# Patient Record
Sex: Female | Born: 1975 | Race: White | Hispanic: No | Marital: Married | Smoking: Current every day smoker
Health system: Southern US, Community
[De-identification: ages and names within clinical notes are randomized; demographics above are authoritative.]

## PROBLEM LIST (undated history)

## (undated) DIAGNOSIS — IMO0002 Reserved for concepts with insufficient information to code with codable children: Secondary | ICD-10-CM

## (undated) DIAGNOSIS — M069 Rheumatoid arthritis, unspecified: Secondary | ICD-10-CM

## (undated) DIAGNOSIS — J449 Chronic obstructive pulmonary disease, unspecified: Secondary | ICD-10-CM

## (undated) DIAGNOSIS — M329 Systemic lupus erythematosus, unspecified: Secondary | ICD-10-CM

## (undated) DIAGNOSIS — F419 Anxiety disorder, unspecified: Secondary | ICD-10-CM

## (undated) DIAGNOSIS — F32A Depression, unspecified: Secondary | ICD-10-CM

## (undated) DIAGNOSIS — M797 Fibromyalgia: Secondary | ICD-10-CM

## (undated) DIAGNOSIS — F329 Major depressive disorder, single episode, unspecified: Secondary | ICD-10-CM

## (undated) DIAGNOSIS — F319 Bipolar disorder, unspecified: Secondary | ICD-10-CM

## (undated) HISTORY — PX: CHOLECYSTECTOMY: SHX55

## (undated) HISTORY — PX: TONSILLECTOMY AND ADENOIDECTOMY: SUR1326

## (undated) HISTORY — PX: GASTRIC BYPASS: SHX52

---

## 2017-07-30 ENCOUNTER — Emergency Department (HOSPITAL_BASED_OUTPATIENT_CLINIC_OR_DEPARTMENT_OTHER)
Admission: EM | Admit: 2017-07-30 | Discharge: 2017-07-30 | Disposition: A | Discharge status: Home / Self Care | Payer: Medicare Other | Attending: Emergency Medicine | Admitting: Emergency Medicine

## 2017-07-30 ENCOUNTER — Emergency Department (HOSPITAL_BASED_OUTPATIENT_CLINIC_OR_DEPARTMENT_OTHER): Payer: Medicare Other

## 2017-07-30 ENCOUNTER — Other Ambulatory Visit: Payer: Self-pay

## 2017-07-30 ENCOUNTER — Encounter (HOSPITAL_BASED_OUTPATIENT_CLINIC_OR_DEPARTMENT_OTHER): Payer: Self-pay | Admitting: Emergency Medicine

## 2017-07-30 DIAGNOSIS — J449 Chronic obstructive pulmonary disease, unspecified: Secondary | ICD-10-CM | POA: Diagnosis not present

## 2017-07-30 DIAGNOSIS — F17210 Nicotine dependence, cigarettes, uncomplicated: Secondary | ICD-10-CM | POA: Diagnosis not present

## 2017-07-30 DIAGNOSIS — M79671 Pain in right foot: Secondary | ICD-10-CM

## 2017-07-30 HISTORY — DX: Chronic obstructive pulmonary disease, unspecified: J44.9

## 2017-07-30 HISTORY — DX: Anxiety disorder, unspecified: F41.9

## 2017-07-30 HISTORY — DX: Systemic lupus erythematosus, unspecified: M32.9

## 2017-07-30 HISTORY — DX: Bipolar disorder, unspecified: F31.9

## 2017-07-30 HISTORY — DX: Fibromyalgia: M79.7

## 2017-07-30 HISTORY — DX: Major depressive disorder, single episode, unspecified: F32.9

## 2017-07-30 HISTORY — DX: Reserved for concepts with insufficient information to code with codable children: IMO0002

## 2017-07-30 HISTORY — DX: Rheumatoid arthritis, unspecified: M06.9

## 2017-07-30 HISTORY — DX: Depression, unspecified: F32.A

## 2017-07-30 MED ORDER — TRAMADOL HCL 50 MG PO TABS
50.0000 mg | ORAL_TABLET | Freq: Once | ORAL | Status: AC
  Administered 2017-07-30: 50 mg via ORAL
  Filled 2017-07-30: qty 1

## 2017-07-30 MED ORDER — IBUPROFEN 800 MG PO TABS: 800.0000 mg | ORAL_TABLET | Freq: Three times a day (TID) | ORAL | 0 refills | Status: AC | PRN

## 2017-07-30 MED ORDER — TRAMADOL HCL 50 MG PO TABS: 50.0000 mg | ORAL_TABLET | Freq: Four times a day (QID) | ORAL | 0 refills | Status: AC | PRN

## 2017-07-30 NOTE — ED Triage Notes (Signed)
Pt c/o bil foot pain x 2 wks; no injury

## 2017-07-30 NOTE — ED Notes (Signed)
EDP into see & update with results and plan.

## 2017-07-30 NOTE — Discharge Instructions (Signed)
You were seen in the ED today with foot pain. Be sure to wear supportive shoes and place a small amount of gauze or cotton between the painful toes on the left to prevent friction.

## 2017-07-30 NOTE — ED Notes (Signed)
To xray by stretcher

## 2017-07-30 NOTE — ED Notes (Signed)
Alert, NAD, calm, interactive, resps e/u, speaking in clear complete sentences, no dyspnea noted, skin W&D, initial VSS, c/o bilateral foot pain, numbness and tingling (denies: sob, nausea, dizziness or visual changes). [facility]  Describes R heel pain. L 3rd, 4th & 5th digit pain numbness and tingling. L>R. Worse when walking, "lots of walking for work". Skin intact, CMS, ROM intact.

## 2017-07-30 NOTE — ED Provider Notes (Signed)
Emergency Department Provider Note   I have reviewed the triage vital signs and the nursing notes.   HISTORY  Chief Complaint Foot Pain   HPI Melinda Walsh is a 41 y.o. female presents to the emergency department for evaluation of pain in bilateral feet.  The right heel feels like there is a needle sticking in the heel.  She denies any injury or history of stepping on something.  Also having pain in the left third, fourth, fifth toes.  Symptoms been ongoing for the past several weeks.  She is on her feet all day at work is been preventing worsening pain. Worse with standing or pressure on the foot. No modifying factors.    Past Medical History:  Diagnosis Date  . Anxiety   . Bipolar disorder (HCC)   . COPD (chronic obstructive pulmonary disease) (HCC)   . Depression   . Fibromyalgia   . Lupus   . Rheumatoid arthritis (HCC)     There are no active problems to display for this patient.   Past Surgical History:  Procedure Laterality Date  . CHOLECYSTECTOMY    . GASTRIC BYPASS    . TONSILLECTOMY AND ADENOIDECTOMY        Allergies Bactrim [sulfamethoxazole-trimethoprim]; Depakote [divalproex sodium]; Doxycycline; and Penicillins  No family history on file.  Social History Social History   Tobacco Use  . Smoking status: Current Every Day Smoker    Types: Cigarettes  . Smokeless tobacco: Never Used  Substance Use Topics  . Alcohol use: Yes    Comment: occ  . Drug use: No    Review of Systems  Constitutional: No fever/chills Eyes: No visual changes. ENT: No sore throat. Cardiovascular: Denies chest pain. Respiratory: Denies shortness of breath. Gastrointestinal: No abdominal pain.  No nausea, no vomiting.  No diarrhea.  No constipation. Genitourinary: Negative for dysuria. Musculoskeletal: Negative for back pain. Positive foot pain.  Skin: Negative for rash. Neurological: Negative for headaches, focal weakness or numbness.  10-point ROS otherwise  negative.  ____________________________________________   PHYSICAL EXAM:  VITAL SIGNS: Vitals:   07/30/17 2005  BP: 119/83  Pulse: 76  Temp: 98.1 F (36.7 C)  SpO2: 99%    Constitutional: Alert and oriented. Well appearing and in no acute distress. Eyes: Conjunctivae are normal.  Head: Atraumatic. Nose: No congestion/rhinnorhea. Mouth/Throat: Mucous membranes are moist.   Neck: No stridor.  Cardiovascular: Normal rate, regular rhythm. Good peripheral circulation. Grossly normal heart sounds.   Respiratory: Normal respiratory effort.  No retractions. Lungs CTAB. Gastrointestinal: Soft and nontender. No distention.  Musculoskeletal: No lower extremity tenderness nor edema. No gross deformities of extremities. Neurologic:  Normal speech and language. No gross focal neurologic deficits are appreciated.  Skin:  Skin is warm, dry and intact. No rash noted. Mild redness to the left 3-5th toes. No cellulitis or ulceration. Isolated skin thickening of the right heel.   ____________________________________________  RADIOLOGY  Dg Foot Complete Right  Result Date: 07/30/2017 CLINICAL DATA:  Right heel pain 2 weeks.  No injury. EXAM: RIGHT FOOT COMPLETE - 3+ VIEW COMPARISON:  None. FINDINGS: There is no evidence of fracture or dislocation. There is no evidence of arthropathy or other focal bone abnormality. Soft tissues are unremarkable. IMPRESSION: Negative. Electronically Signed   By: Elberta Fortisaniel  Boyle M.D.   On: 07/30/2017 20:42    ____________________________________________   PROCEDURES  Procedure(s) performed:   Procedures  None ____________________________________________   INITIAL IMPRESSION / ASSESSMENT AND PLAN / ED COURSE  Pertinent labs &  imaging results that were available during my care of the patient were reviewed by me and considered in my medical decision making (see chart for details).  Patient has a focal area of tenderness on the right heel.  Plan for  imaging to rule out foreign body or bony injury.  The pain in the left foot seems to be from friction between the toes.  Advised she wear supportive shoes and place a small amount of cotton or other buffered to keep the toes from rubbing together.  No open wounds or ulcerations.  Also will provide contact information for podiatry.   At this time, I do not feel there is any life-threatening condition present. I have reviewed and discussed all results (EKG, imaging, lab, urine as appropriate), exam findings with patient. I have reviewed nursing notes and appropriate previous records.  I feel the patient is safe to be discharged home without further emergent workup. Discussed usual and customary return precautions. Patient and family (if present) verbalize understanding and are comfortable with this plan.  Patient will follow-up with their primary care provider. If they do not have a primary care provider, information for follow-up has been provided to them. All questions have been answered.  ____________________________________________  FINAL CLINICAL IMPRESSION(S) / ED DIAGNOSES  Final diagnoses:  Foot pain, right     MEDICATIONS GIVEN DURING THIS VISIT:  Medications  traMADol (ULTRAM) tablet 50 mg (50 mg Oral Given 07/30/17 2029)     NEW OUTPATIENT MEDICATIONS STARTED DURING THIS VISIT:  Motrin and Tramadol   Note:  This document was prepared using Dragon voice recognition software and may include unintentional dictation errors.  Alona BeneJoshua Long, MD Emergency Medicine    Long, Arlyss RepressJoshua G, MD 07/31/17 [phone]

## 2017-08-09 ENCOUNTER — Ambulatory Visit: Payer: Medicare Other | Admitting: Podiatry

## 2017-11-12 ENCOUNTER — Ambulatory Visit (HOSPITAL_COMMUNITY): Payer: Medicare Other | Admitting: Psychiatry

## 2017-12-05 ENCOUNTER — Ambulatory Visit (HOSPITAL_COMMUNITY): Payer: Medicare Other | Admitting: Psychiatry

## 2018-07-25 IMAGING — CR DG FOOT COMPLETE 3+V*R*
3 series · 3 of 3 positions shown · non-contrast
Comparison: None.

CLINICAL DATA: Right heel pain 2 weeks.  No injury.

EXAM:
RIGHT FOOT COMPLETE - 3+ VIEW

[t foot ap right]
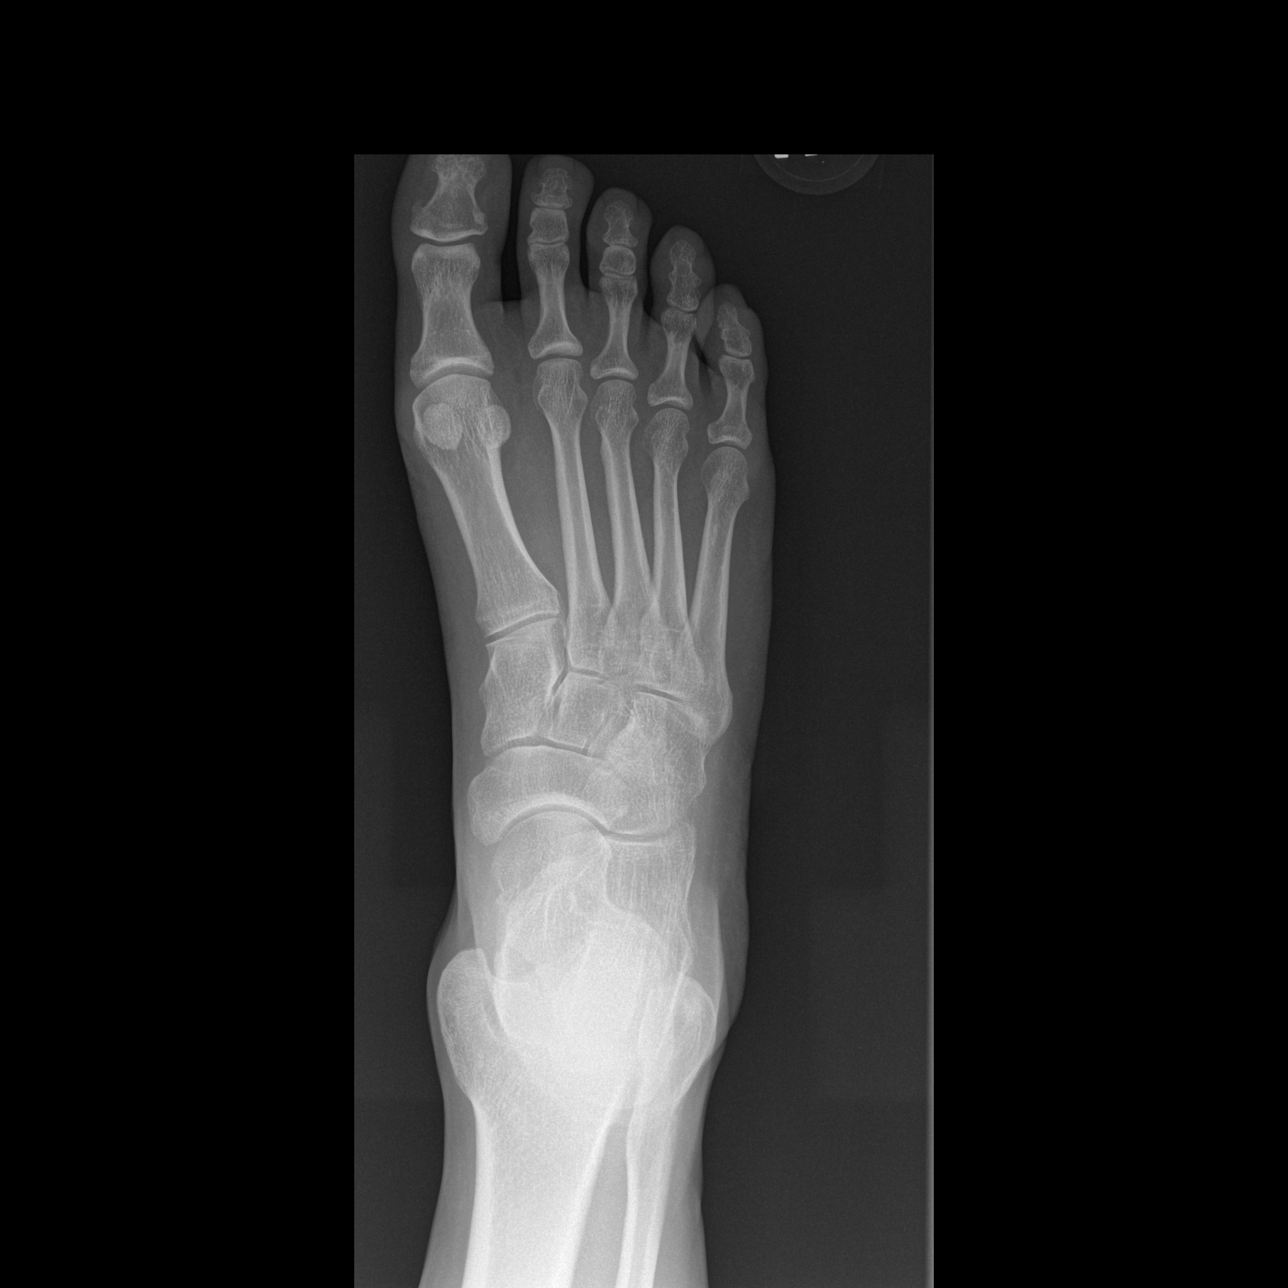

[t foot oblique right]
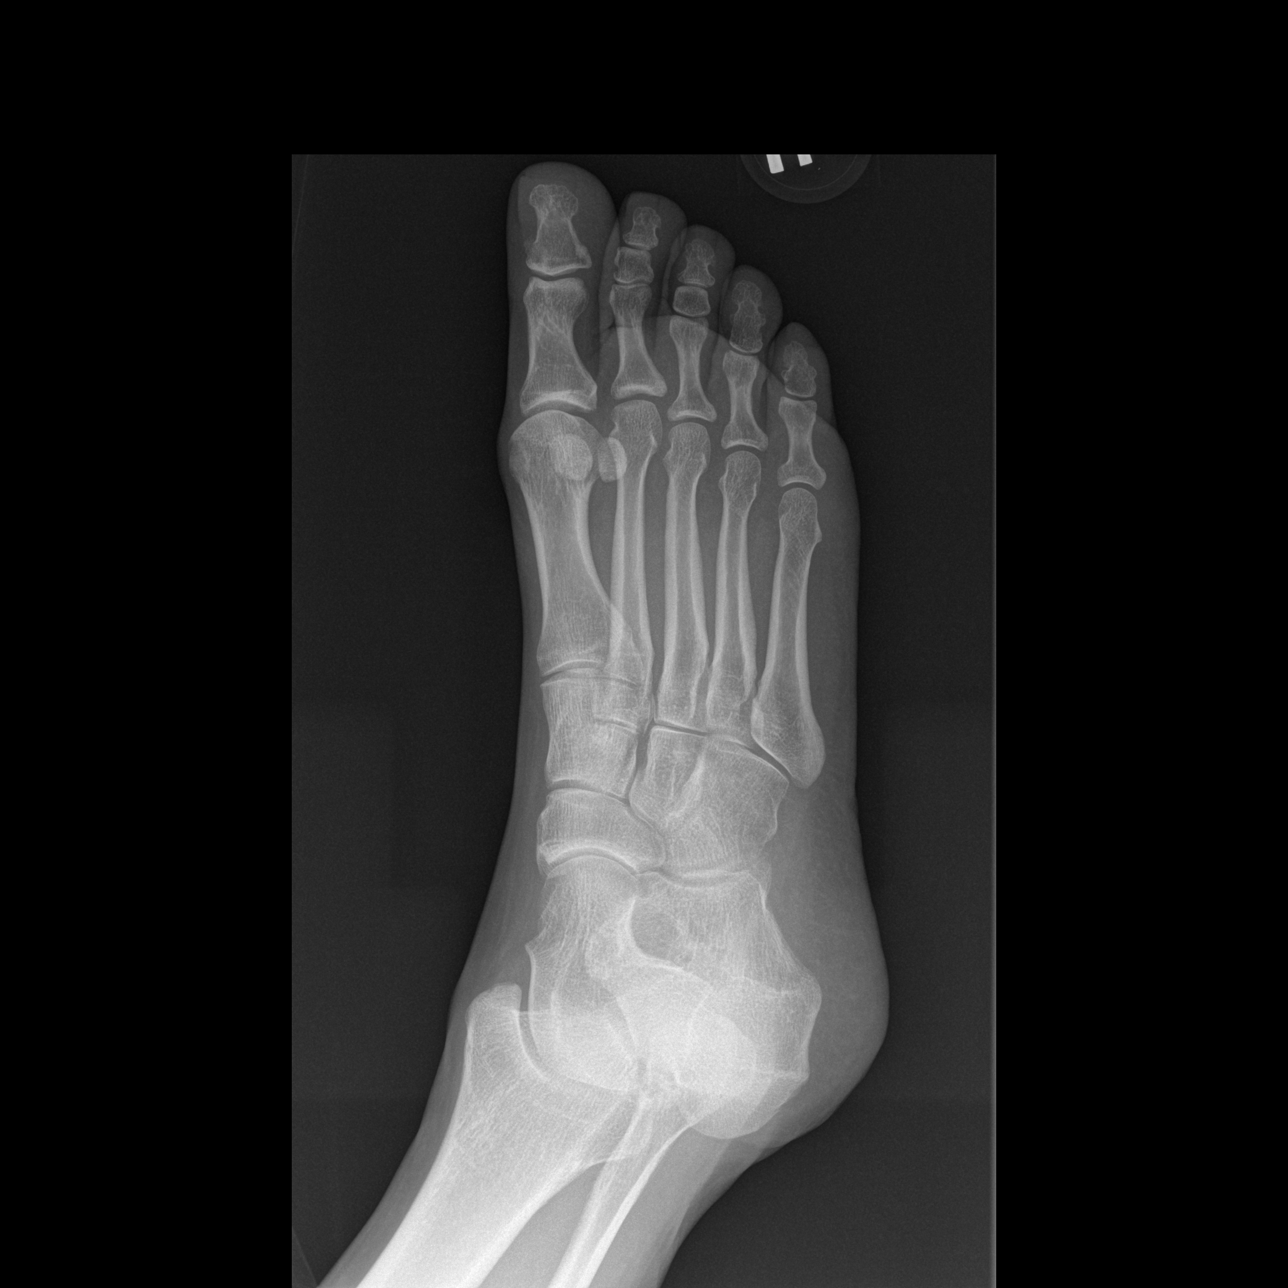

[t foot lat right]
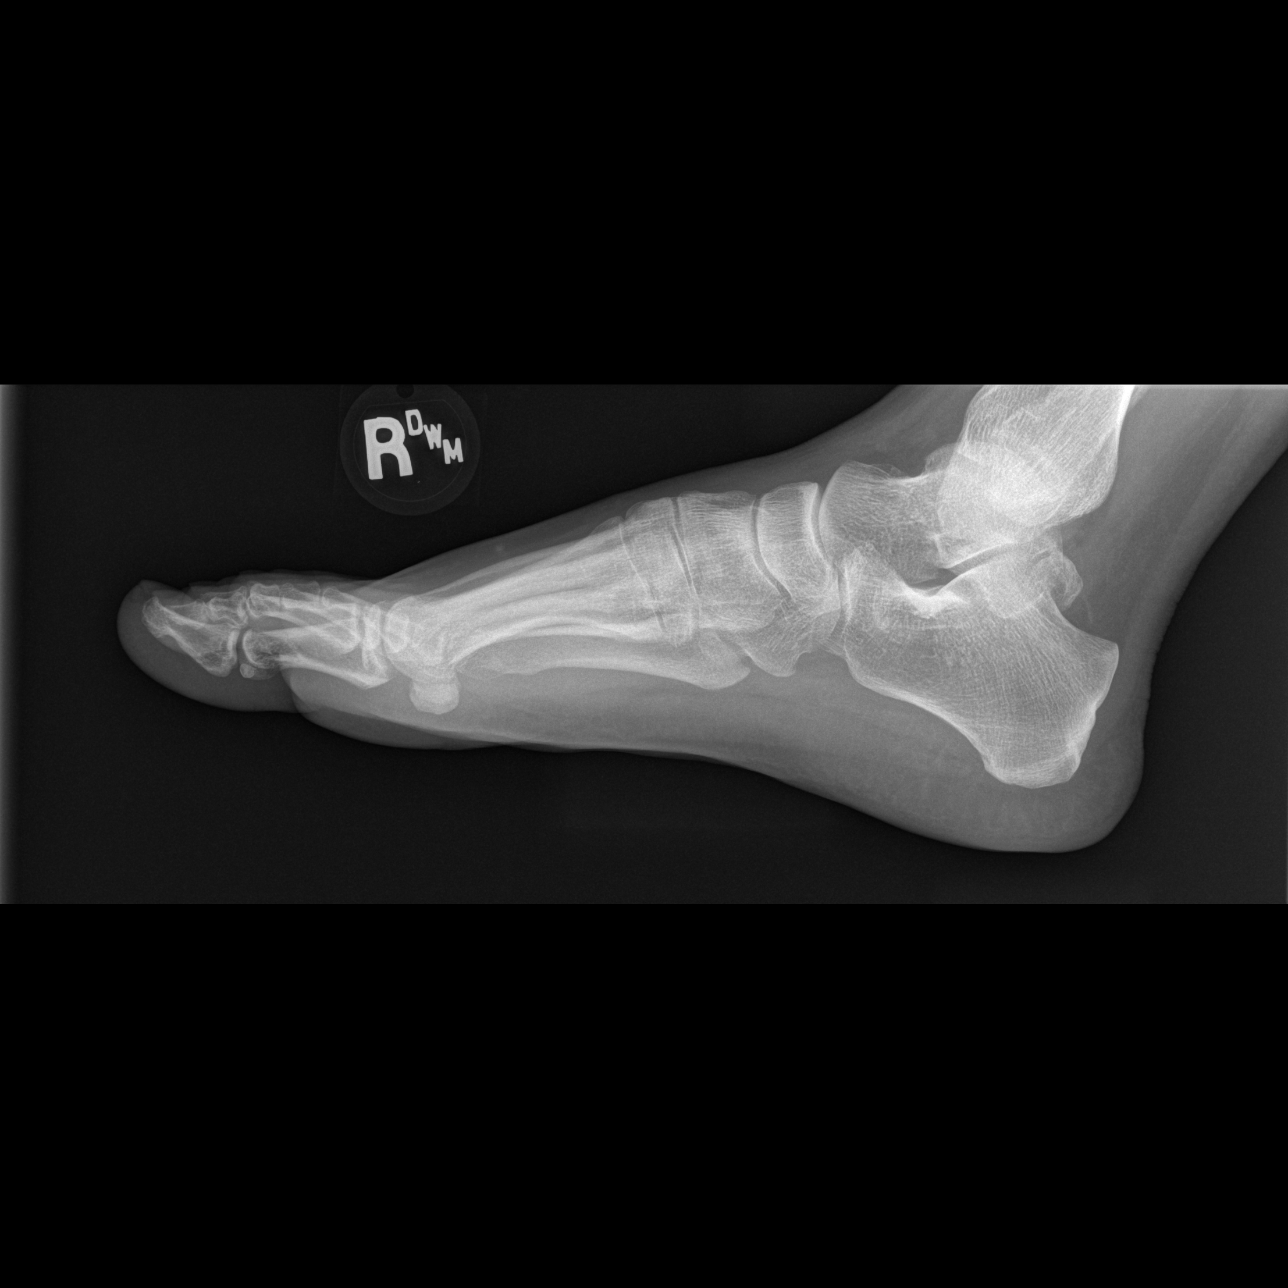

[3 of 3 positions shown; findings below may reference images not displayed]

FINDINGS: There is no evidence of fracture or dislocation. There is no
evidence of arthropathy or other focal bone abnormality. Soft
tissues are unremarkable.
IMPRESSION: Negative.
